# Patient Record
Sex: Male | Born: 1970 | Race: White | Hispanic: No | Marital: Single | Smoking: Never smoker
Health system: Southern US, Community
[De-identification: ages and names within clinical notes are randomized; demographics above are authoritative.]

## PROBLEM LIST (undated history)

## (undated) DIAGNOSIS — K219 Gastro-esophageal reflux disease without esophagitis: Secondary | ICD-10-CM

## (undated) DIAGNOSIS — R42 Dizziness and giddiness: Secondary | ICD-10-CM

## (undated) HISTORY — PX: WISDOM TOOTH EXTRACTION: SHX21

---

## 2009-07-06 ENCOUNTER — Emergency Department: Payer: Self-pay | Admitting: Internal Medicine
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
## 2015-02-13 ENCOUNTER — Encounter: Payer: Self-pay | Admitting: Emergency Medicine

## 2015-02-13 ENCOUNTER — Emergency Department
Admission: EM | Admit: 2015-02-13 | Discharge: 2015-02-13 | Disposition: A | Discharge status: Home / Self Care | Payer: BLUE CROSS/BLUE SHIELD | Attending: Emergency Medicine | Admitting: Emergency Medicine

## 2015-02-13 DIAGNOSIS — K2970 Gastritis, unspecified, without bleeding: Secondary | ICD-10-CM | POA: Diagnosis not present

## 2015-02-13 DIAGNOSIS — R1013 Epigastric pain: Secondary | ICD-10-CM

## 2015-02-13 DIAGNOSIS — R109 Unspecified abdominal pain: Secondary | ICD-10-CM | POA: Diagnosis present

## 2015-02-13 LAB — COMPREHENSIVE METABOLIC PANEL
ALT: 81 U/L — AB (ref 17–63)
AST: 84 U/L — AB (ref 15–41)
Albumin: 4.7 g/dL (ref 3.5–5.0)
Alkaline Phosphatase: 84 U/L (ref 38–126)
Anion gap: 8 (ref 5–15)
BUN: 13 mg/dL (ref 6–20)
CHLORIDE: 100 mmol/L — AB (ref 101–111)
CO2: 30 mmol/L (ref 22–32)
CREATININE: 0.97 mg/dL (ref 0.61–1.24)
Calcium: 9.3 mg/dL (ref 8.9–10.3)
GFR calc Af Amer: >60 mL/min (ref >60)
GLUCOSE: 130 mg/dL — AB (ref 65–99)
Potassium: 4.4 mmol/L (ref 3.5–5.1)
SODIUM: 138 mmol/L (ref 135–145)
Total Bilirubin: 1.1 mg/dL (ref 0.3–1.2)
Total Protein: 8.3 g/dL — ABNORMAL HIGH (ref 6.5–8.1)

## 2015-02-13 LAB — CBC
HCT: 47.1 % (ref 40.0–52.0)
Hemoglobin: 16.5 g/dL (ref 13.0–18.0)
MCH: 30.6 pg (ref 26.0–34.0)
MCHC: 35.1 g/dL (ref 32.0–36.0)
MCV: 87.1 fL (ref 80.0–100.0)
PLATELETS: 150 10*3/uL (ref 150–440)
RBC: 5.4 MIL/uL (ref 4.40–5.90)
RDW: 12.8 % (ref 11.5–14.5)
WBC: 5.3 10*3/uL (ref 3.8–10.6)

## 2015-02-13 LAB — URINALYSIS COMPLETE WITH MICROSCOPIC (ARMC ONLY)
BILIRUBIN URINE: NEGATIVE
Bacteria, UA: NONE SEEN
GLUCOSE, UA: NEGATIVE mg/dL
Ketones, ur: NEGATIVE mg/dL
Leukocytes, UA: NEGATIVE
Nitrite: NEGATIVE
Protein, ur: NEGATIVE mg/dL
Specific Gravity, Urine: 1.017 (ref 1.005–1.030)
pH: 6 (ref 5.0–8.0)

## 2015-02-13 LAB — LIPASE, BLOOD: LIPASE: 20 U/L (ref 11–51)

## 2015-02-13 MED ORDER — RANITIDINE HCL 150 MG PO TABS: 150.0000 mg | ORAL_TABLET | Freq: Every day | ORAL | Status: DC

## 2015-02-13 NOTE — ED Provider Notes (Addendum)
[facility] Emergency Department Provider Note     Time seen: ----------------------------------------- 10:55 AM on 02/13/2015 -----------------------------------------    I have reviewed the triage vital signs and the nursing notes.   HISTORY  Chief Complaint Abdominal Pain    HPI Nathan Fitzpatrick is a 44 y.o. male who presents to ER with a full feeling over the last couple days. Patient feels like his food is not going down, he had GI symptoms starting this weekend. He has a lot of vomiting but no diarrhea, is passing gas and able to burp. He denies fevers chills or other complaints. Earlier he was having some upper abdominal pressure but this is gone now.   History reviewed. No pertinent past medical history.  There are no active problems to display for this patient.   History reviewed. No pertinent past surgical history.  Allergies Review of patient's allergies indicates no known allergies.  Social History Social History  Substance Use Topics  . Smoking status: Never Smoker   . Smokeless tobacco: None  . Alcohol Use: No    Review of Systems Constitutional: Negative for fever. Eyes: Negative for visual changes. ENT: Negative for sore throat. Cardiovascular: Negative for chest pain. Respiratory: Negative for shortness of breath. Gastrointestinal: Positive for abdominal pain Genitourinary: Negative for dysuria. Musculoskeletal: Negative for back pain. Skin: Negative for rash. Neurological: Negative for headaches, focal weakness or numbness.  10-point ROS otherwise negative.  ____________________________________________   PHYSICAL EXAM:  VITAL SIGNS: ED Triage Vitals  Enc Vitals Group     BP 02/13/15 0828 118/84 mmHg     Pulse Rate 02/13/15 0828 109     Resp 02/13/15 0828 20     Temp 02/13/15 0828 98 F (36.7 C)     Temp Source 02/13/15 0828 Oral     SpO2 02/13/15 0828 98 %     Weight 02/13/15 0828 192 lb  (87.091 kg)     Height 02/13/15 0828 5\' 10"  (1.778 m)     Head Cir --      Peak Flow --      Pain Score 02/13/15 0828 4     Pain Loc --      Pain Edu? --      Excl. in [place]? --     Constitutional: Alert and oriented. Well appearing and in no distress. Eyes: Conjunctivae are normal. PERRL. Normal extraocular movements. ENT   Head: Normocephalic and atraumatic.   Nose: No congestion/rhinnorhea.   Mouth/Throat: Mucous membranes are moist.   Neck: No stridor. Cardiovascular: Normal rate, regular rhythm. Normal and symmetric distal pulses are present in all extremities. No murmurs, rubs, or gallops. Respiratory: Normal respiratory effort without tachypnea nor retractions. Breath sounds are clear and equal bilaterally. No wheezes/rales/rhonchi. Gastrointestinal: Soft and nontender. No distention. No abdominal bruits.  Musculoskeletal: Nontender with normal range of motion in all extremities. No joint effusions.  No lower extremity tenderness nor edema. Neurologic:  Normal speech and language. No gross focal neurologic deficits are appreciated. Speech is normal. No gait instability. Skin:  Skin is warm, dry and intact. No rash noted. Psychiatric: Mood and affect are normal. Speech and behavior are normal. Patient exhibits appropriate insight and judgment.  ____________________________________________  ED COURSE:  Pertinent labs & imaging results that were available during my care of the patient were reviewed by me and considered in my medical decision making (see chart for details). Patient is in no acute distress, will check basic labs and likely imaging and GI cocktail.  _ EKG: Interpreted by me normal EKG, normal sinus rhythm, with normal axis, normal intervals. No evidence of hypertrophy or acute infarction. Rate is 95 bpm. ___________________________________________    LABS (pertinent positives/negatives)  Labs Reviewed  COMPREHENSIVE METABOLIC PANEL - Abnormal; Notable  for the following:    Chloride 100 (*)    Glucose, Bld 130 (*)    Total Protein 8.3 (*)    AST 84 (*)    ALT 81 (*)    All other components within normal limits  URINALYSIS COMPLETEWITH MICROSCOPIC ([facility] ONLY) - Abnormal; Notable for the following:    Color, Urine YELLOW (*)    APPearance CLEAR (*)    Hgb urine dipstick 2+ (*)    Squamous Epithelial / LPF 0-5 (*)    All other components within normal limits  LIPASE, BLOOD  CBC    ____________________________________________  FINAL ASSESSMENT AND PLAN  Gastritis  Plan: Patient with labs and imaging as dictated above. Patient with recent gastritis, I offered GI cocktail basic x-rays. Patient declined at this time. His labs are normal except for mild liver transaminase elevation. I will refer to GI for outpatient follow-up. Currently symptoms have resolved he'll be discharged with Zantac.   Earleen Newport, MD   Earleen Newport, MD 02/13/15 1114  Earleen Newport, MD 02/13/15 1119  Earleen Newport, MD 03/04/15 1712  Earleen Newport, MD 03/04/15 [phone]

## 2015-02-13 NOTE — ED Notes (Addendum)
States he is having a full feeling for the past couple of days   Feels like his food is not going down started with GI sx's this weekend .Marland Kitchen

## 2015-02-13 NOTE — Discharge Instructions (Signed)

## 2016-07-10 ENCOUNTER — Other Ambulatory Visit: Payer: Self-pay

## 2016-07-10 ENCOUNTER — Encounter: Payer: Self-pay | Admitting: Gastroenterology

## 2016-07-10 ENCOUNTER — Ambulatory Visit (INDEPENDENT_AMBULATORY_CARE_PROVIDER_SITE_OTHER): Payer: Self-pay | Admitting: Gastroenterology

## 2016-07-10 VITALS — BP 131/86 | HR 84 | Temp 97.5°F | Ht 70.0 in | Wt 186.0 lb

## 2016-07-10 DIAGNOSIS — K219 Gastro-esophageal reflux disease without esophagitis: Secondary | ICD-10-CM

## 2016-07-10 NOTE — Progress Notes (Signed)
Gastroenterology Consultation  Referring Provider:     No ref. provider found Primary Care Physician:  Patient, No Pcp Per Primary Gastroenterologist:  Dr. Allen Norris     Reason for Consultation:     GERD        HPI:   Nathan Fitzpatrick is a 46 y.o. y/o male referred for consultation & management of GERD by Dr. Patient, No Pcp Per.  This patient comes in today with a report of a few weeks of a tickle in his throat that then became severe heartburn. The patient denies ever having this before these episodes. He also reports that he started omeprazole 20 mg a day and states he got some improvement from that but not complete. The patient then started taking 20 mg of omeprazole twice a day with better results but still having some acid breakthrough. The patient denies any black stools bloody stools fevers chills nausea or vomiting. There is also no report of any unexplained weight loss or food is stuck whereupon he has to vomit the food out.  No past medical history on file.  No past surgical history on file.  Prior to Admission medications   Medication Sig Start Date End Date Taking? Authorizing Provider  omeprazole (PRILOSEC OTC) 20 MG tablet Take 20 mg by mouth daily.   Yes [provider]  amoxicillin-clavulanate (AUGMENTIN) 875-125 MG tablet amoxicillin 875 mg-potassium clavulanate 125 mg tablet  Take 1 tablet every 12 hours by oral route.    [provider]  etodolac (LODINE) 500 MG tablet etodolac 500 mg tablet  TAKE 1 TABLET BY MOUTH TWICE A DAY WITH FOOD    [provider]  ranitidine (ZANTAC) 150 MG tablet Take 1 tablet (150 mg total) by mouth at bedtime. 02/13/15 02/13/16  Earleen Newport, MD    No family history on file.   Social History  Substance Use Topics  . Smoking status: Never Smoker  . Smokeless tobacco: Never Used  . Alcohol use No    Allergies as of 07/10/2016  . (No Known Allergies)    Review of Systems:    All systems  reviewed and negative except where noted in HPI.   Physical Exam:  BP 131/86   Pulse 84   Temp 97.5 F (36.4 C) (Oral)   Ht 5\' 10"  (1.778 m)   Wt 186 lb (84.4 kg)   BMI 26.69 kg/m  No LMP for male patient. Psych:  Alert and cooperative. Normal mood and affect. General:   Alert,  Well-developed, well-nourished, pleasant and cooperative in NAD Head:  Normocephalic and atraumatic. Eyes:  Sclera clear, no icterus.   Conjunctiva pink. Ears:  Normal auditory acuity. Nose:  No deformity, discharge, or lesions. Mouth:  No deformity or lesions,oropharynx pink & moist. Neck:  Supple; no masses or thyromegaly. Lungs:  Respirations even and unlabored.  Clear throughout to auscultation.   No wheezes, crackles, or rhonchi. No acute distress. Heart:  Regular rate and rhythm; no murmurs, clicks, rubs, or gallops. Abdomen:  Normal bowel sounds.  No bruits.  Soft, non-tender and non-distended without masses, hepatosplenomegaly or hernias noted.  No guarding or rebound tenderness.  Negative Carnett sign.   Rectal:  Deferred.  Msk:  Symmetrical without gross deformities.  Good, equal movement & strength bilaterally. Pulses:  Normal pulses noted. Extremities:  No clubbing or edema.  No cyanosis. Neurologic:  Alert and oriented x3;  grossly normal neurologically. Skin:  Intact without significant lesions or rashes.  No jaundice.  Lymph Nodes:  No significant cervical adenopathy. Psych:  Alert and cooperative. Normal mood and affect.  Imaging Studies: No results found.  Assessment and Plan:   Nathan Fitzpatrick is a 46 y.o. y/o male with a history of reflux that has started a few weeks ago as a tickle in the throat and then patient reports overt heartburn. The patient has been treated with omeprazole 20 mg twice a day with recurrent acid breakthrough. The patient will be started on a trial of Dexilant and set up for an EGD. I have discussed risks & benefits which include, but are not limited  to, bleeding, infection, perforation & drug reaction.  The patient agrees with this plan & written consent will be obtained.     Lucilla Lame, MD. Marval Regal   Note: This dictation was prepared with Dragon dictation along with smaller phrase technology. Any transcriptional errors that result from this process are unintentional.

## 2016-09-04 ENCOUNTER — Telehealth: Payer: Self-pay | Admitting: Gastroenterology

## 2016-09-04 NOTE — Telephone Encounter (Signed)
Patient left a voice message that he needs to schedule an EGD

## 2016-09-05 ENCOUNTER — Other Ambulatory Visit: Payer: Self-pay

## 2016-09-05 DIAGNOSIS — K219 Gastro-esophageal reflux disease without esophagitis: Secondary | ICD-10-CM

## 2016-09-05 NOTE — Telephone Encounter (Signed)
Appt for EGD has been scheduled for Monday August 13th at [facility].

## 2016-09-22 ENCOUNTER — Encounter: Payer: Self-pay | Admitting: Anesthesiology

## 2016-09-25 ENCOUNTER — Other Ambulatory Visit: Payer: Self-pay

## 2016-09-25 DIAGNOSIS — K2100 Gastro-esophageal reflux disease with esophagitis, without bleeding: Secondary | ICD-10-CM

## 2016-09-26 ENCOUNTER — Encounter: Payer: Self-pay | Admitting: Anesthesiology

## 2016-09-26 ENCOUNTER — Ambulatory Visit
Admission: RE | Admit: 2016-09-26 | Payer: Managed Care, Other (non HMO) | Source: Ambulatory Visit | Admitting: Gastroenterology

## 2016-09-26 ENCOUNTER — Encounter: Admission: RE | Payer: Self-pay | Source: Ambulatory Visit

## 2016-09-26 SURGERY — ESOPHAGOGASTRODUODENOSCOPY (EGD) WITH PROPOFOL
Anesthesia: General

## 2016-09-29 ENCOUNTER — Other Ambulatory Visit: Payer: Self-pay

## 2016-09-29 ENCOUNTER — Ambulatory Visit: Admission: RE | Admit: 2016-09-29 | Payer: Self-pay | Source: Ambulatory Visit | Admitting: Gastroenterology

## 2016-09-29 DIAGNOSIS — K2100 Gastro-esophageal reflux disease with esophagitis, without bleeding: Secondary | ICD-10-CM

## 2016-09-29 HISTORY — DX: Gastro-esophageal reflux disease without esophagitis: K21.9

## 2016-09-29 HISTORY — DX: Dizziness and giddiness: R42

## 2016-09-29 SURGERY — ESOPHAGOGASTRODUODENOSCOPY (EGD) WITH PROPOFOL
Anesthesia: Choice

## 2016-10-14 ENCOUNTER — Ambulatory Visit
Admission: RE | Admit: 2016-10-14 | Discharge: 2016-10-14 | Disposition: A | Discharge status: Home / Self Care | Payer: Managed Care, Other (non HMO) | Source: Ambulatory Visit | Attending: Gastroenterology | Admitting: Gastroenterology

## 2016-10-14 ENCOUNTER — Encounter
Admission: RE | Disposition: A | Discharge status: Home / Self Care | Payer: Self-pay | Source: Ambulatory Visit | Attending: Gastroenterology

## 2016-10-14 ENCOUNTER — Ambulatory Visit: Payer: Managed Care, Other (non HMO) | Admitting: Anesthesiology

## 2016-10-14 DIAGNOSIS — K2950 Unspecified chronic gastritis without bleeding: Secondary | ICD-10-CM | POA: Insufficient documentation

## 2016-10-14 DIAGNOSIS — K219 Gastro-esophageal reflux disease without esophagitis: Secondary | ICD-10-CM | POA: Diagnosis present

## 2016-10-14 DIAGNOSIS — Z79899 Other long term (current) drug therapy: Secondary | ICD-10-CM | POA: Diagnosis not present

## 2016-10-14 DIAGNOSIS — R1310 Dysphagia, unspecified: Secondary | ICD-10-CM

## 2016-10-14 DIAGNOSIS — D490 Neoplasm of unspecified behavior of digestive system: Secondary | ICD-10-CM

## 2016-10-14 HISTORY — PX: ESOPHAGOGASTRODUODENOSCOPY (EGD) WITH PROPOFOL: SHX5813

## 2016-10-14 SURGERY — ESOPHAGOGASTRODUODENOSCOPY (EGD) WITH PROPOFOL
Anesthesia: General

## 2016-10-14 MED ORDER — SODIUM CHLORIDE 0.9 % IV SOLN
INTRAVENOUS | Status: DC | PRN
  Administered 2016-10-14: 1000 mL
  Administered 2016-10-14: 11:00:00 via INTRAVENOUS

## 2016-10-14 MED ORDER — LIDOCAINE 2% (20 MG/ML) 5 ML SYRINGE
INTRAMUSCULAR | Status: DC | PRN
  Administered 2016-10-14: 40 mg via INTRAVENOUS

## 2016-10-14 MED ORDER — PROPOFOL 10 MG/ML IV BOLUS
INTRAVENOUS | Status: DC | PRN
  Administered 2016-10-14: 100 mg via INTRAVENOUS

## 2016-10-14 MED ORDER — PROPOFOL 500 MG/50ML IV EMUL
INTRAVENOUS | Status: DC | PRN
  Administered 2016-10-14: 180 ug/kg/min via INTRAVENOUS

## 2016-10-14 MED ORDER — PROPOFOL 500 MG/50ML IV EMUL
INTRAVENOUS | Status: AC
Start: 2016-10-14 — End: 2016-10-14
  Filled 2016-10-14: qty 50

## 2016-10-14 NOTE — Transfer of Care (Signed)
Immediate Anesthesia Transfer of Care Note  Patient: Nathan Fitzpatrick  Procedure(s) Performed: Procedure(s): ESOPHAGOGASTRODUODENOSCOPY (EGD) WITH PROPOFOL (N/A)  Patient Location: PACU and Endoscopy Unit  Anesthesia Type:General  Level of Consciousness: awake and patient cooperative  Airway & Oxygen Therapy: Patient Spontanous Breathing and Patient connected to nasal cannula oxygen  Post-op Assessment: Report given to RN and Post -op Vital signs reviewed and stable  Post vital signs: stable  Last Vitals:  Vitals:   10/14/16 1144 10/14/16 1149  BP: 110/77 110/77  Pulse: 60 (!) 59  Resp: 13 14  Temp: 36.6 C   SpO2: 100% 100%    Last Pain:  Vitals:   10/14/16 1144  TempSrc: Tympanic         Complications: No apparent anesthesia complications

## 2016-10-14 NOTE — Anesthesia Post-op Follow-up Note (Signed)
Anesthesia QCDR form completed.        

## 2016-10-14 NOTE — Anesthesia Preprocedure Evaluation (Signed)
Anesthesia Evaluation  Patient identified by MRN, date of birth, ID band Patient awake    Reviewed: Allergy & Precautions, NPO status , Patient's Chart, lab work & pertinent test results  Airway Mallampati: II  TM Distance: >3 FB     Dental  (+) Teeth Intact   Pulmonary    Pulmonary exam normal        Cardiovascular negative cardio ROS Normal cardiovascular exam     Neuro/Psych vertigo negative psych ROS   GI/Hepatic Neg liver ROS, GERD  Medicated,  Endo/Other  negative endocrine ROS  Renal/GU negative Renal ROS  negative genitourinary   Musculoskeletal negative musculoskeletal ROS (+)   Abdominal   Peds negative pediatric ROS (+)  Hematology negative hematology ROS (+)   Anesthesia Other Findings   Reproductive/Obstetrics                             Anesthesia Physical Anesthesia Plan  ASA: II  Anesthesia Plan: General   Post-op Pain Management:    Induction: Intravenous  PONV Risk Score and Plan:   Airway Management Planned: Nasal Cannula  Additional Equipment:   Intra-op Plan:   Post-operative Plan:   Informed Consent: I have reviewed the patients History and Physical, chart, labs and discussed the procedure including the risks, benefits and alternatives for the proposed anesthesia with the patient or authorized representative who has indicated his/her understanding and acceptance.   Dental advisory given  Plan Discussed with: CRNA and Surgeon  Anesthesia Plan Comments:         Anesthesia Quick Evaluation

## 2016-10-14 NOTE — H&P (Signed)
   Lucilla Lame, MD [facility] [address] [place] [place],  [address] Phone:[phone] Fax : [phone]  Primary Care Physician:  Stoney Bang, MD Primary Gastroenterologist:  Dr. Allen Norris  Pre-Procedure History & Physical: HPI:  Masashi Snowdon is a 46 y.o. male is here for an endoscopy.   Past Medical History:  Diagnosis Date  . GERD (gastroesophageal reflux disease)   . Vertigo    at times    Past Surgical History:  Procedure Laterality Date  . WISDOM TOOTH EXTRACTION      Prior to Admission medications   Medication Sig Start Date End Date Taking? Authorizing Provider  omeprazole (PRILOSEC OTC) 20 MG tablet Take 20 mg by mouth daily.   Yes [provider]  amoxicillin-clavulanate (AUGMENTIN) 875-125 MG tablet amoxicillin 875 mg-potassium clavulanate 125 mg tablet  Take 1 tablet every 12 hours by oral route.    [provider]  etodolac (LODINE) 500 MG tablet etodolac 500 mg tablet  TAKE 1 TABLET BY MOUTH TWICE A DAY WITH FOOD    [provider]  ranitidine (ZANTAC) 150 MG tablet Take 1 tablet (150 mg total) by mouth at bedtime. 02/13/15 02/13/16  Earleen Newport, MD    Allergies as of 09/29/2016  . (No Known Allergies)    No family history on file.  Social History   Social History  . Marital status: Single    Spouse name: N/A  . Number of children: N/A  . Years of education: N/A   Occupational History  . Not on file.   Social History Main Topics  . Smoking status: Never Smoker  . Smokeless tobacco: Never Used  . Alcohol use No  . Drug use: No  . Sexual activity: Not on file   Other Topics Concern  . Not on file   Social History Narrative  . No narrative on file    Review of Systems: See HPI, otherwise negative ROS  Physical Exam: BP 119/83   Pulse 69   Temp 97.8 F (36.6 C) (Tympanic)   Resp 20   Ht 5\' 10"  (1.778 m)   Wt 180 lb (81.6 kg)   SpO2 100%   BMI 25.83 kg/m  General:    Alert,  pleasant and cooperative in NAD Head:  Normocephalic and atraumatic. Neck:  Supple; no masses or thyromegaly. Lungs:  Clear throughout to auscultation.    Heart:  Regular rate and rhythm. Abdomen:  Soft, nontender and nondistended. Normal bowel sounds, without guarding, and without rebound.   Neurologic:  Alert and  oriented x4;  grossly normal neurologically.  Impression/Plan: Samuell Knoble is here for an endoscopy to be performed for GERD  Risks, benefits, limitations, and alternatives regarding  endoscopy have been reviewed with the patient.  Questions have been answered.  All parties agreeable.   Lucilla Lame, MD  10/14/2016, 10:55 AM

## 2016-10-14 NOTE — Anesthesia Postprocedure Evaluation (Signed)
Anesthesia Post Note  Patient: Nathan Fitzpatrick  Procedure(s) Performed: Procedure(s) (LRB): ESOPHAGOGASTRODUODENOSCOPY (EGD) WITH PROPOFOL (N/A)  Patient location during evaluation: PACU Anesthesia Type: General Level of consciousness: awake and alert and oriented Pain management: pain level controlled Vital Signs Assessment: post-procedure vital signs reviewed and stable Respiratory status: spontaneous breathing Cardiovascular status: blood pressure returned to baseline Anesthetic complications: no     Last Vitals:  Vitals:   10/14/16 1144 10/14/16 1149  BP: 110/77 110/77  Pulse: 60 (!) 59  Resp: 13 14  Temp: 36.6 C   SpO2: 100% 100%    Last Pain:  Vitals:   10/14/16 1144  TempSrc: Tympanic                 Nickoles Gregori

## 2016-10-14 NOTE — Op Note (Signed)
[facility] Patient Name: Nathan Fitzpatrick Procedure Date: 10/14/2016 11:24 AM MRN: [id number] Account #: [id number] Date of Birth: [birth year] Admit Type: Outpatient Age: 46 Room: [facility] ENDO ROOM 4 Gender: Male Note Status: Finalized Procedure:            Upper GI endoscopy Indications:          Heartburn Providers:            Lucilla Lame MD, MD Referring MD:         No Local Md, MD (Referring MD) Medicines:            Propofol per Anesthesia Complications:        No immediate complications. Procedure:            Pre-Anesthesia Assessment:                       - Prior to the procedure, a History and Physical was                        performed, and patient medications and allergies were                        reviewed. The patient's tolerance of previous                        anesthesia was also reviewed. The risks and benefits of                        the procedure and the sedation options and risks were                        discussed with the patient. All questions were                        answered, and informed consent was obtained. Prior                        Anticoagulants: The patient has taken no previous                        anticoagulant or antiplatelet agents. ASA Grade                        Assessment: II - A patient with mild systemic disease.                        After reviewing the risks and benefits, the patient was                        deemed in satisfactory condition to undergo the                        procedure.                       After obtaining informed consent, the endoscope was                        passed under direct vision. Throughout the procedure,  the patient's blood pressure, pulse, and oxygen                        saturations were monitored continuously. The Endoscope                        was introduced through the mouth, and advanced to the                        second  part of duodenum. The upper GI endoscopy was                        accomplished without difficulty. The patient tolerated                        the procedure well. Findings:      The examined esophagus was normal.      Localized mild inflammation characterized by erythema was found in the       gastric antrum. Biopsies were taken with a cold forceps for histology.      The examined duodenum was normal. Impression:           - Normal esophagus.                       - Gastritis. Biopsied.                       - Normal examined duodenum. Recommendation:       - Discharge patient to home.                       - Resume previous diet.                       - Continue present medications.                       - Await pathology results. Procedure Code(s):    --- Professional ---                       [phone], Esophagogastroduodenoscopy, flexible, transoral;                        with biopsy, single or multiple Diagnosis Code(s):    --- Professional ---                       R12, Heartburn                       K29.70, Gastritis, unspecified, without bleeding CPT copyright 2016 American Medical Association. All rights reserved. The codes documented in this report are preliminary and upon coder review may  be revised to meet current compliance requirements. Lucilla Lame MD, MD 10/14/2016 11:36:14 AM This report has been signed electronically. Number of Addenda: 0 Note Initiated On: 10/14/2016 11:24 AM      [facility]

## 2016-10-15 LAB — SURGICAL PATHOLOGY

## 2016-10-16 ENCOUNTER — Encounter: Payer: Self-pay | Admitting: Gastroenterology

## 2017-04-09 ENCOUNTER — Other Ambulatory Visit: Payer: Self-pay

## 2017-04-09 MED ORDER — DEXLANSOPRAZOLE 60 MG PO CPDR: 60.0000 mg | DELAYED_RELEASE_CAPSULE | Freq: Every day | ORAL | 6 refills | Status: DC

## 2017-05-04 ENCOUNTER — Telehealth: Payer: Self-pay | Admitting: Gastroenterology

## 2017-05-04 NOTE — Telephone Encounter (Signed)
*  STAT* If patient is at the pharmacy, call can be transferred to refill team.   1. Which medications need to be refilled? (please list name of each medication and dose if known) Dexilant   2. Which pharmacy/location (including street and city if local pharmacy) is medication to be sent to? [facility]  3. Do they need a 30 day or 90 day supply? 30 day

## 2017-05-05 ENCOUNTER — Telehealth: Payer: Self-pay | Admitting: Gastroenterology

## 2017-05-05 NOTE — Telephone Encounter (Signed)
Pt left vm for Dr. Allen Norris he needs rx Dexaliane  Needs prior Authorization for [facility] court pharamcy please call [phone] to give them prior auth

## 2017-05-06 NOTE — Telephone Encounter (Signed)
Prior authorization has been obtained for Dexilant 60mg  via express scripts and [email or web address].  [id number];FXJOIT:[id number];Review Type:Prior Auth;Coverage Start Date:04/06/2017;Coverage End Date:05/06/2018;

## 2017-05-25 ENCOUNTER — Encounter: Payer: Self-pay | Admitting: Gastroenterology

## 2017-05-25 ENCOUNTER — Ambulatory Visit: Payer: Managed Care, Other (non HMO) | Admitting: Gastroenterology

## 2017-05-25 ENCOUNTER — Encounter (INDEPENDENT_AMBULATORY_CARE_PROVIDER_SITE_OTHER): Payer: Self-pay

## 2017-05-25 VITALS — BP 120/82 | HR 70 | Ht 70.0 in | Wt 196.0 lb

## 2017-05-25 DIAGNOSIS — K219 Gastro-esophageal reflux disease without esophagitis: Secondary | ICD-10-CM

## 2017-05-25 NOTE — Progress Notes (Signed)
Primary Care Physician: Pauline Good Thornell Mule, MD  Primary Gastroenterologist:  Dr. Lucilla Lame  Chief Complaint  Patient presents with  . Gastroesophageal Reflux    HPI: Nathan Fitzpatrick is a 47 y.o. male here for follow-up of heartburn and GERD. The patient reports he is taking Dexilant daily now and states that he has intermittent pain in the lower part of his chest.  He does not report this to be the same burning sensation he had when his heartburn started.  The patient also denies any dysphagia or unexplained weight loss.  The patient has had an EGD that did not show any signs of the cause of his present symptoms.  He reports that the discomfort in his chest/epigastric region is usually at night and not associated with any particular foods or even with eating.  Current Outpatient Medications  Medication Sig Dispense Refill  . dexlansoprazole (DEXILANT) 60 MG capsule Take 1 capsule (60 mg total) by mouth daily. 30 capsule 6  . etodolac (LODINE) 500 MG tablet etodolac 500 mg tablet  TAKE 1 TABLET BY MOUTH TWICE A DAY WITH FOOD    . omeprazole (PRILOSEC OTC) 20 MG tablet Take 20 mg by mouth daily.    . ranitidine (ZANTAC) 150 MG tablet Take 1 tablet (150 mg total) by mouth at bedtime. 30 tablet 1   No current facility-administered medications for this visit.     Allergies as of 05/25/2017  . (No Known Allergies)    ROS:  General: Negative for anorexia, weight loss, fever, chills, fatigue, weakness. ENT: Negative for hoarseness, difficulty swallowing , nasal congestion. CV: Negative for chest pain, angina, palpitations, dyspnea on exertion, peripheral edema.  Respiratory: Negative for dyspnea at rest, dyspnea on exertion, cough, sputum, wheezing.  GI: See history of present illness. GU:  Negative for dysuria, hematuria, urinary incontinence, urinary frequency, nocturnal urination.  Endo: Negative for unusual weight change.    Physical Examination:   BP 120/82    Pulse 70   Ht 5\' 10"  (1.778 m)   Wt 196 lb (88.9 kg)   BMI 28.12 kg/m   General: Well-nourished, well-developed in no acute distress.  Eyes: No icterus. Conjunctivae pink. Mouth: Oropharyngeal mucosa moist and pink , no lesions erythema or exudate. Lungs: Clear to auscultation bilaterally. Non-labored. Heart: Regular rate and rhythm, no murmurs rubs or gallops.  Abdomen: Bowel sounds are normal, nontender, nondistended, no hepatosplenomegaly or masses, no abdominal bruits or hernia , no rebound or guarding.   Extremities: No lower extremity edema. No clubbing or deformities. Neuro: Alert and oriented x 3.  Grossly intact. Skin: Warm and dry, no jaundice.   Psych: Alert and cooperative, normal mood and affect.  Labs:    Imaging Studies: No results found.  Assessment and Plan:   Nathan Fitzpatrick is a 47 y.o. y/o male who comes in today with a history of heartburn who is taking Dexilant.  The patient has now reported intermittent epigastric/chest discomfort. The patient denies this to be related to any particular foods or does he notice anything that makes it better or worse.  The patient has been told to take Tums when he has his discomfort to see if it goes away thereby discriminating it from a non-GI cause of his symptoms.  The patient has been told that if his symptoms continue after taking the antacids than we may decrease of Dexilant to 30 mg a day to see if that controls his heartburn symptoms.  If his symptoms improve  on the antacids then we may need to switch him to Protonix twice a day since he may be having acid breakthrough that is resolved with Tums.  The patient has been explained the plan and will contact us with the results of his supplemental antacids.    Lucilla Lame, MD. Marval Regal   Note: This dictation was prepared with Dragon dictation along with smaller phrase technology. Any transcriptional errors that result from this process are unintentional.

## 2017-05-27 ENCOUNTER — Telehealth: Payer: Self-pay | Admitting: Gastroenterology

## 2017-05-27 ENCOUNTER — Other Ambulatory Visit: Payer: Self-pay

## 2017-05-27 DIAGNOSIS — K2100 Gastro-esophageal reflux disease with esophagitis, without bleeding: Secondary | ICD-10-CM

## 2017-05-27 NOTE — Telephone Encounter (Signed)
I advised pt if he is wanting a referral for anti reflux surgery he will will need a PH study and Manometry. I have ordered and scheduled these procedures. He says you talked with him about this at his last office appt.

## 2017-05-27 NOTE — Telephone Encounter (Signed)
Pt left vm to speak to Dr. Allen Norris in regards to seeking a surgical solution for his GERD rather then medicating it please call pt

## 2017-05-28 NOTE — Telephone Encounter (Signed)
Yes I did talk to him but he said he wanted to think about it. If he is ready to go forward then please set him up with surgery. I think [facility] May be doing it but we can find out if [facility] does it.

## 2017-06-11 ENCOUNTER — Telehealth: Payer: Self-pay

## 2017-06-11 NOTE — Telephone Encounter (Signed)
Patient has been informed that our Esophageal Manometry Equipment is being repaired at this time, and a complete test would not be available.  Discussed with Dr. Allen Norris and he advised canceling the test, and having the patient to see a surgeon.  Informed patient of this and he is in agreement with this.  Patient will look into finding a surgeon and will contact us back when he finds one.  I offered to refer, and he said he will look into it.  Thanks [employer]

## 2017-06-16 ENCOUNTER — Telehealth: Payer: Self-pay | Admitting: Gastroenterology

## 2017-06-16 NOTE — Telephone Encounter (Signed)
Emailed pt regarding this referral to [facility] for a surgical consultation.

## 2017-06-16 NOTE — Telephone Encounter (Signed)
Pt left vm he states Dr. Allen Norris was supposed to do a manometry study but Sharyn Lull told pt there was a problem with the machines and that pt was going to receive a referral pt would like a call to check on status this

## 2017-06-17 ENCOUNTER — Encounter: Admission: RE | Payer: Self-pay | Source: Ambulatory Visit

## 2017-06-17 ENCOUNTER — Ambulatory Visit
Admission: RE | Admit: 2017-06-17 | Payer: Managed Care, Other (non HMO) | Source: Ambulatory Visit | Admitting: Gastroenterology

## 2017-06-17 SURGERY — MANOMETRY, ESOPHAGUS

## 2017-06-23 DIAGNOSIS — K219 Gastro-esophageal reflux disease without esophagitis: Secondary | ICD-10-CM | POA: Insufficient documentation

## 2017-07-23 ENCOUNTER — Telehealth: Payer: Self-pay | Admitting: Gastroenterology

## 2017-07-23 NOTE — Telephone Encounter (Signed)
Patient LVM and would like for you to email him back so he know you received his email.

## 2017-12-17 ENCOUNTER — Telehealth: Payer: Self-pay | Admitting: Gastroenterology

## 2017-12-17 ENCOUNTER — Other Ambulatory Visit: Payer: Self-pay

## 2017-12-17 NOTE — Telephone Encounter (Signed)
Pt is calling to get refill on rx Amprezole 10 mg send to [facility]

## 2017-12-18 ENCOUNTER — Other Ambulatory Visit: Payer: Self-pay

## 2017-12-18 MED ORDER — OMEPRAZOLE MAGNESIUM 20 MG PO TBEC: 20.0000 mg | DELAYED_RELEASE_TABLET | Freq: Every day | ORAL | 6 refills | Status: DC

## 2017-12-18 NOTE — Telephone Encounter (Signed)
Refill for Omeprazole has been sent to [facility] per pt request.

## 2017-12-24 ENCOUNTER — Telehealth: Payer: Self-pay | Admitting: Gastroenterology

## 2017-12-24 NOTE — Telephone Encounter (Signed)
Pt called he needs the rx  For 10 mg it was called in for more then that please refill for 10 mg

## 2017-12-25 NOTE — Telephone Encounter (Signed)
[facility] to change the mg of Omeprazole 20 to 10mg . Pt was notified this was done.

## 2018-03-31 ENCOUNTER — Other Ambulatory Visit: Payer: Self-pay

## 2018-03-31 MED ORDER — OMEPRAZOLE 10 MG PO CPDR: 10.0000 mg | DELAYED_RELEASE_CAPSULE | Freq: Every day | ORAL | 3 refills | Status: DC

## 2019-11-23 ENCOUNTER — Other Ambulatory Visit: Payer: Self-pay

## 2019-11-23 ENCOUNTER — Other Ambulatory Visit
Admission: RE | Admit: 2019-11-23 | Discharge: 2019-11-23 | Disposition: A | Discharge status: Home / Self Care | Payer: 59 | Source: Ambulatory Visit | Attending: Gastroenterology | Admitting: Gastroenterology

## 2019-11-23 DIAGNOSIS — Z20822 Contact with and (suspected) exposure to covid-19: Secondary | ICD-10-CM | POA: Insufficient documentation

## 2019-11-23 DIAGNOSIS — Z01818 Encounter for other preprocedural examination: Secondary | ICD-10-CM | POA: Insufficient documentation

## 2019-11-23 LAB — SARS CORONAVIRUS 2 (TAT 6-24 HRS): SARS Coronavirus 2: NEGATIVE

## 2019-11-24 ENCOUNTER — Encounter: Payer: Self-pay | Admitting: *Deleted

## 2019-11-25 ENCOUNTER — Ambulatory Visit: Payer: 59 | Admitting: Certified Registered Nurse Anesthetist

## 2019-11-25 ENCOUNTER — Ambulatory Visit
Admission: RE | Admit: 2019-11-25 | Discharge: 2019-11-25 | Disposition: A | Discharge status: Home / Self Care | Payer: 59 | Attending: Gastroenterology | Admitting: Gastroenterology

## 2019-11-25 ENCOUNTER — Encounter: Payer: Self-pay | Admitting: *Deleted

## 2019-11-25 ENCOUNTER — Other Ambulatory Visit: Payer: Self-pay

## 2019-11-25 ENCOUNTER — Encounter
Admission: RE | Disposition: A | Discharge status: Home / Self Care | Payer: Self-pay | Source: Home / Self Care | Attending: Gastroenterology

## 2019-11-25 DIAGNOSIS — Z1211 Encounter for screening for malignant neoplasm of colon: Secondary | ICD-10-CM | POA: Diagnosis not present

## 2019-11-25 DIAGNOSIS — K219 Gastro-esophageal reflux disease without esophagitis: Secondary | ICD-10-CM | POA: Diagnosis not present

## 2019-11-25 DIAGNOSIS — D128 Benign neoplasm of rectum: Secondary | ICD-10-CM | POA: Insufficient documentation

## 2019-11-25 DIAGNOSIS — Z79899 Other long term (current) drug therapy: Secondary | ICD-10-CM | POA: Diagnosis not present

## 2019-11-25 DIAGNOSIS — K5730 Diverticulosis of large intestine without perforation or abscess without bleeding: Secondary | ICD-10-CM | POA: Insufficient documentation

## 2019-11-25 HISTORY — PX: COLONOSCOPY WITH PROPOFOL: SHX5780

## 2019-11-25 SURGERY — COLONOSCOPY WITH PROPOFOL
Anesthesia: General

## 2019-11-25 MED ORDER — LIDOCAINE HCL (CARDIAC) PF 100 MG/5ML IV SOSY
PREFILLED_SYRINGE | INTRAVENOUS | Status: DC | PRN
  Administered 2019-11-25: 50 mg via INTRAVENOUS

## 2019-11-25 MED ORDER — PROPOFOL 10 MG/ML IV BOLUS
INTRAVENOUS | Status: DC | PRN
  Administered 2019-11-25: 18 mg via INTRAVENOUS
  Administered 2019-11-25: 80 mg via INTRAVENOUS

## 2019-11-25 MED ORDER — SODIUM CHLORIDE 0.9 % IV SOLN
INTRAVENOUS | Status: DC
  Administered 2019-11-25: 1000 mL via INTRAVENOUS

## 2019-11-25 MED ORDER — PROPOFOL 500 MG/50ML IV EMUL
INTRAVENOUS | Status: DC | PRN
  Administered 2019-11-25: 130 ug/kg/min via INTRAVENOUS

## 2019-11-25 MED ORDER — PROPOFOL 500 MG/50ML IV EMUL
INTRAVENOUS | Status: AC
  Filled 2019-11-25: qty 50

## 2019-11-25 NOTE — Anesthesia Postprocedure Evaluation (Signed)
Anesthesia Post Note  Patient: [name]  Procedure(s) Performed: COLONOSCOPY WITH PROPOFOL (N/A )  Patient location during evaluation: Endoscopy Anesthesia Type: General Level of consciousness: awake and alert Pain management: pain level controlled Vital Signs Assessment: post-procedure vital signs reviewed and stable Respiratory status: spontaneous breathing, nonlabored ventilation, respiratory function stable and patient connected to nasal cannula oxygen Cardiovascular status: blood pressure returned to baseline and stable Postop Assessment: no apparent nausea or vomiting Anesthetic complications: no   No complications documented.   Last Vitals:  Vitals:   11/25/19 1248 11/25/19 1351  BP: 118/89   Pulse: 92 75  Resp: 20 15  Temp: (!) 35.9 C (!) 36.1 C  SpO2: 99% 96%    Last Pain:  Vitals:   11/25/19 1351  TempSrc: Temporal                 Arita Miss

## 2019-11-25 NOTE — Transfer of Care (Signed)
Immediate Anesthesia Transfer of Care Note  Patient: Nathan Fitzpatrick  Procedure(s) Performed: COLONOSCOPY WITH PROPOFOL (N/A )  Patient Location: PACU and Endoscopy Unit  Anesthesia Type:General  Level of Consciousness: drowsy  Airway & Oxygen Therapy: Patient Spontanous Breathing  Post-op Assessment: Report given to RN and Post -op Vital signs reviewed and stable  Post vital signs: Reviewed and stable  Last Vitals:  Vitals Value Taken Time  BP 96/75 11/25/19 1352  Temp 36.1 C 11/25/19 1351  Pulse 77 11/25/19 1352  Resp 17 11/25/19 1352  SpO2 96 % 11/25/19 1352  Vitals shown include unvalidated device data.  Last Pain:  Vitals:   11/25/19 1351  TempSrc: Temporal      Patients Stated Pain Goal: 0 (06/34/94 9447)  Complications: No complications documented.

## 2019-11-25 NOTE — H&P (Signed)
Outpatient short stay form Pre-procedure 11/25/2019 1:17 PM Nathan Miyamoto MD, MPH  Primary Physician: Dr. Pauline Good  Reason for visit:  Screening Colonoscopy  History of present illness:   49 y/o gentleman with history of GERD here for screening colonoscopy. No family history of GI malignancies. No blood thinners. No abdominal surgeries.    Current Facility-Administered Medications:  .  0.9 %  sodium chloride infusion, , Intravenous, Continuous, Machaela Caterino, Hilton Cork, MD, Last Rate: 20 mL/hr at 11/25/19 1309, 1,000 mL at 11/25/19 1309  Medications Prior to Admission  Medication Sig Dispense Refill Last Dose  . dexlansoprazole (DEXILANT) 60 MG capsule Take 1 capsule (60 mg total) by mouth daily. (Patient not taking: Reported on 11/25/2019) 30 capsule 6 Completed Course at Unknown time  . etodolac (LODINE) 500 MG tablet etodolac 500 mg tablet  TAKE 1 TABLET BY MOUTH TWICE A DAY WITH FOOD (Patient not taking: Reported on 11/25/2019)   Not Taking at Unknown time  . omeprazole (PRILOSEC OTC) 20 MG tablet Take 1 tablet (20 mg total) by mouth daily. (Patient not taking: Reported on 11/25/2019) 30 tablet 6 Not Taking at Unknown time  . omeprazole (PRILOSEC) 10 MG capsule Take 1 capsule (10 mg total) by mouth daily. 90 capsule 3      No Known Allergies   Past Medical History:  Diagnosis Date  . GERD (gastroesophageal reflux disease)   . Vertigo    at times    Review of systems:  Otherwise negative.    Physical Exam  Gen: Alert, oriented. Appears stated age.  HEENT: [place]/AT. PERRLA. Lungs: No respiratory distress Abd: soft, benign, no masses. Ext: No edema.     Planned procedures: Proceed with colonoscopy. The patient understands the nature of the planned procedure, indications, risks, alternatives and potential complications including but not limited to bleeding, infection, perforation, damage to internal organs and possible oversedation/side effects from anesthesia. The patient  agrees and gives consent to proceed.  Please refer to procedure notes for findings, recommendations and patient disposition/instructions.     Nathan Miyamoto MD, MPH Gastroenterology 11/25/2019  1:17 PM

## 2019-11-25 NOTE — Op Note (Signed)
[facility] Gastroenterology Patient Name: Nathan Fitzpatrick Procedure Date: 11/25/2019 1:24 PM MRN: [id number] Account #: [id number] Date of Birth: [birth year] Admit Type: Outpatient Age: 49 Room: [facility] ENDO ROOM 3 Gender: Male Note Status: Finalized Procedure:             Colonoscopy Indications:           Screening for colorectal malignant neoplasm Providers:             Andrey Farmer MD, MD Referring MD:          No Local Md, MD (Referring MD) Medicines:             Monitored Anesthesia Care Complications:         No immediate complications. Estimated blood loss:                         Minimal. Procedure:             Pre-Anesthesia Assessment:                        - Prior to the procedure, a History and Physical was                         performed, and patient medications and allergies were                         reviewed. The patient is competent. The risks and                         benefits of the procedure and the sedation options and                         risks were discussed with the patient. All questions                         were answered and informed consent was obtained.                         Patient identification and proposed procedure were                         verified by the physician, the nurse, the anesthetist                         and the technician in the endoscopy suite. Mental                         Status Examination: alert and oriented. Airway                         Examination: normal oropharyngeal airway and neck                         mobility. Respiratory Examination: clear to                         auscultation. CV Examination: normal. Prophylactic  Antibiotics: The patient does not require prophylactic                         antibiotics. Prior Anticoagulants: The patient has                         taken no previous anticoagulant or antiplatelet                         agents. ASA Grade  Assessment: I - A normal, healthy                         patient. After reviewing the risks and benefits, the                         patient was deemed in satisfactory condition to                         undergo the procedure. The anesthesia plan was to use                         monitored anesthesia care (MAC). Immediately prior to                         administration of medications, the patient was                         re-assessed for adequacy to receive sedatives. The                         heart rate, respiratory rate, oxygen saturations,                         blood pressure, adequacy of pulmonary ventilation, and                         response to care were monitored throughout the                         procedure. The physical status of the patient was                         re-assessed after the procedure.                        After obtaining informed consent, the colonoscope was                         passed under direct vision. Throughout the procedure,                         the patient's blood pressure, pulse, and oxygen                         saturations were monitored continuously. The                         Colonoscope was introduced through the anus and  advanced to the the terminal ileum. The colonoscopy                         was performed without difficulty. The patient                         tolerated the procedure well. The quality of the bowel                         preparation was excellent. Findings:      The perianal and digital rectal examinations were normal.      The terminal ileum appeared normal.      A few small-mouthed diverticula were found in the sigmoid colon.      A 3 mm polyp was found in the rectum. The polyp was sessile. The polyp       was removed with a cold snare. Resection and retrieval were complete.       Estimated blood loss was minimal.      The exam was otherwise without abnormality on direct and  retroflexion       views. Impression:            - The examined portion of the ileum was normal.                        - Diverticulosis in the sigmoid colon.                        - One 3 mm polyp in the rectum, removed with a cold                         snare. Resected and retrieved.                        - The examination was otherwise normal on direct and                         retroflexion views. Recommendation:        - Discharge patient to home.                        - Resume previous diet.                        - Continue present medications.                        - Await pathology results.                        - Repeat colonoscopy in 7 years for surveillance.                        - Return to referring physician as previously                         scheduled. Procedure Code(s):     --- Professional ---                        [phone], Colonoscopy, flexible; with removal of  tumor(s), polyp(s), or other lesion(s) by snare                         technique Diagnosis Code(s):     --- Professional ---                        Z12.11, Encounter for screening for malignant neoplasm                         of colon                        K62.1, Rectal polyp                        K57.30, Diverticulosis of large intestine without                         perforation or abscess without bleeding CPT copyright 2019 American Medical Association. All rights reserved. The codes documented in this report are preliminary and upon coder review may  be revised to meet current compliance requirements. Andrey Farmer, MD Andrey Farmer MD, MD 11/25/2019 1:50:25 PM Number of Addenda: 0 Note Initiated On: 11/25/2019 1:24 PM Scope Withdrawal Time: 0 hours 12 minutes 18 seconds  Total Procedure Duration: 0 hours 17 minutes 30 seconds  Estimated Blood Loss:  Estimated blood loss: none. Estimated blood loss was                         minimal.      [facility]

## 2019-11-25 NOTE — Anesthesia Preprocedure Evaluation (Signed)
Anesthesia Evaluation  Patient identified by MRN, date of birth, ID band Patient awake    Reviewed: Allergy & Precautions, NPO status , Patient's Chart, lab work & pertinent test results  History of Anesthesia Complications Negative for: history of anesthetic complications  Airway Mallampati: I  TM Distance: >3 FB     Dental  (+) Teeth Intact, Caps, Dental Advidsory Given   Pulmonary neg pulmonary ROS,    Pulmonary exam normal        Cardiovascular negative cardio ROS Normal cardiovascular exam     Neuro/Psych vertigo negative neurological ROS  negative psych ROS   GI/Hepatic Neg liver ROS, GERD (2 yrs ago)  ,  Endo/Other  negative endocrine ROS  Renal/GU negative Renal ROS  negative genitourinary   Musculoskeletal negative musculoskeletal ROS (+)   Abdominal   Peds negative pediatric ROS (+)  Hematology negative hematology ROS (+)   Anesthesia Other Findings Past Medical History: No date: GERD (gastroesophageal reflux disease) No date: Vertigo     Comment:  at times   Reproductive/Obstetrics negative OB ROS                             Anesthesia Physical  Anesthesia Plan  ASA: II  Anesthesia Plan: General   Post-op Pain Management:    Induction: Intravenous  PONV Risk Score and Plan: 2 and Propofol infusion and TIVA  Airway Management Planned: Nasal Cannula and Natural Airway  Additional Equipment:   Intra-op Plan:   Post-operative Plan:   Informed Consent: I have reviewed the patients History and Physical, chart, labs and discussed the procedure including the risks, benefits and alternatives for the proposed anesthesia with the patient or authorized representative who has indicated his/her understanding and acceptance.     Dental advisory given  Plan Discussed with: CRNA and Surgeon  Anesthesia Plan Comments:         Anesthesia Quick Evaluation

## 2019-11-28 ENCOUNTER — Encounter: Payer: Self-pay | Admitting: Gastroenterology

## 2019-11-28 LAB — SURGICAL PATHOLOGY

## 2020-02-18 DIAGNOSIS — N400 Enlarged prostate without lower urinary tract symptoms: Secondary | ICD-10-CM | POA: Insufficient documentation

## 2021-10-07 DIAGNOSIS — E7800 Pure hypercholesterolemia, unspecified: Secondary | ICD-10-CM | POA: Insufficient documentation

## 2021-10-28 ENCOUNTER — Ambulatory Visit
Admission: EM | Admit: 2021-10-28 | Discharge: 2021-10-28 | Disposition: A | Discharge status: Home / Self Care | Payer: 59 | Attending: Physician Assistant | Admitting: Physician Assistant

## 2021-10-28 DIAGNOSIS — B349 Viral infection, unspecified: Secondary | ICD-10-CM | POA: Insufficient documentation

## 2021-10-28 DIAGNOSIS — U071 COVID-19: Secondary | ICD-10-CM | POA: Insufficient documentation

## 2021-10-28 DIAGNOSIS — R051 Acute cough: Secondary | ICD-10-CM | POA: Insufficient documentation

## 2021-10-28 DIAGNOSIS — R0981 Nasal congestion: Secondary | ICD-10-CM | POA: Insufficient documentation

## 2021-10-28 DIAGNOSIS — Z20822 Contact with and (suspected) exposure to covid-19: Secondary | ICD-10-CM | POA: Diagnosis present

## 2021-10-28 LAB — RESP PANEL BY RT-PCR (FLU A&B, COVID) ARPGX2
Influenza A by PCR: NEGATIVE
Influenza B by PCR: NEGATIVE
SARS Coronavirus 2 by RT PCR: POSITIVE — AB

## 2021-10-28 MED ORDER — PROMETHAZINE-DM 6.25-15 MG/5ML PO SYRP: 5.0000 mL | ORAL_SOLUTION | Freq: Four times a day (QID) | ORAL | 0 refills | Status: DC | PRN

## 2021-10-28 NOTE — ED Provider Notes (Signed)
[facility]    CSN: [id number] Arrival date & time: 10/28/21  1623      History   Chief Complaint Chief Complaint  Patient presents with   Cough   Nasal Congestion    HPI Unique Searfoss is a 51 y.o. male presenting for onset of cough, congestion, sore throat, headache, fatigue last night.  Patient reports he has been taking care of his mother and her husband who were recently diagnosed with COVID-19.  He reports concern for COVID-19.  He denies fever, chest pain, shortness of breath, nausea/vomiting or diarrhea.  Patient request to be tested for COVID and flu.  Has not been taking any medicine for symptoms.  No other complaints.  HPI  Past Medical History:  Diagnosis Date   GERD (gastroesophageal reflux disease)    Vertigo    at times    There are no problems to display for this patient.   Past Surgical History:  Procedure Laterality Date   COLONOSCOPY WITH PROPOFOL N/A 11/25/2019   Procedure: COLONOSCOPY WITH PROPOFOL;  Surgeon: Lesly Rubenstein, MD;  Location: [facility] ENDOSCOPY;  Service: Endoscopy;  Laterality: N/A;   ESOPHAGOGASTRODUODENOSCOPY (EGD) WITH PROPOFOL N/A 10/14/2016   Procedure: ESOPHAGOGASTRODUODENOSCOPY (EGD) WITH PROPOFOL;  Surgeon: Lucilla Lame, MD;  Location: [facility] ENDOSCOPY;  Service: Endoscopy;  Laterality: N/A;   WISDOM TOOTH EXTRACTION         Home Medications    Prior to Admission medications   Medication Sig Start Date End Date Taking? Authorizing Provider  promethazine-dextromethorphan (PROMETHAZINE-DM) 6.25-15 MG/5ML syrup Take 5 mLs by mouth 4 (four) times daily as needed. 10/28/21  Yes Danton Clap, PA-C  dexlansoprazole (DEXILANT) 60 MG capsule Take 1 capsule (60 mg total) by mouth daily. Patient not taking: Reported on 11/25/2019 04/09/17   Lucilla Lame, MD  etodolac (LODINE) 500 MG tablet etodolac 500 mg tablet  TAKE 1 TABLET BY MOUTH TWICE A DAY WITH FOOD Patient not taking: No sig reported    [provider]  omeprazole (PRILOSEC OTC) 20 MG tablet Take 1 tablet (20 mg total) by mouth daily. Patient not taking: Reported on 11/25/2019 12/18/17   Lucilla Lame, MD  omeprazole (PRILOSEC) 10 MG capsule Take 1 capsule (10 mg total) by mouth daily. 03/31/18   Lucilla Lame, MD    Family History Family History  Problem Relation Age of Onset   Diverticulitis Mother    Stomach cancer Father    Diverticulitis Maternal Grandmother    Breast cancer Maternal Grandmother     Social History Social History   Tobacco Use   Smoking status: Never   Smokeless tobacco: Never  Vaping Use   Vaping Use: Never used  Substance Use Topics   Alcohol use: No   Drug use: No     Allergies   Patient has no known allergies.   Review of Systems Review of Systems  Constitutional:  Positive for fatigue. Negative for fever.  HENT:  Positive for congestion, rhinorrhea, sinus pressure and sore throat.   Respiratory:  Positive for cough. Negative for shortness of breath.   Gastrointestinal:  Negative for abdominal pain, diarrhea, nausea and vomiting.  Musculoskeletal:  Negative for myalgias.  Neurological:  Positive for headaches. Negative for weakness and light-headedness.  Hematological:  Negative for adenopathy.     Physical Exam Triage Vital Signs ED Triage Vitals  Enc Vitals Group     BP 10/28/21 1706 (!) 131/103     Pulse Rate 10/28/21 1706 (!) 111  Resp --      Temp 10/28/21 1706 98.9 F (37.2 C)     Temp Source 10/28/21 1706 Oral     SpO2 10/28/21 1706 100 %     Weight 10/28/21 1705 205 lb (93 kg)     Height 10/28/21 1705 '5\' 10"'$  (1.778 m)     Head Circumference --      Peak Flow --      Pain Score 10/28/21 1704 6     Pain Loc --      Pain Edu? --      Excl. in [place]? --    No data found.  Updated Vital Signs BP (!) 131/103 (BP Location: Left Arm)   Pulse (!) 111   Temp 98.9 F (37.2 C) (Oral)   Ht '5\' 10"'$  (1.778 m)   Wt 205 lb (93 kg)   SpO2 100%   BMI 29.41 kg/m       Physical Exam Vitals and nursing note reviewed.  Constitutional:      General: He is not in acute distress.    Appearance: Normal appearance. He is well-developed. He is ill-appearing.  HENT:     Head: Normocephalic and atraumatic.     Nose: Congestion present.     Mouth/Throat:     Mouth: Mucous membranes are moist.     Pharynx: Oropharynx is clear. Posterior oropharyngeal erythema present.  Eyes:     General: No scleral icterus.    Conjunctiva/sclera: Conjunctivae normal.  Cardiovascular:     Rate and Rhythm: Regular rhythm. Tachycardia present.  Pulmonary:     Effort: Pulmonary effort is normal. No respiratory distress.     Breath sounds: Normal breath sounds.  Musculoskeletal:     Cervical back: Neck supple.  Skin:    General: Skin is warm and dry.     Capillary Refill: Capillary refill takes less than 2 seconds.  Neurological:     General: No focal deficit present.     Mental Status: He is alert. Mental status is at baseline.     Motor: No weakness.     Gait: Gait normal.  Psychiatric:        Mood and Affect: Mood normal.        Behavior: Behavior normal.        Thought Content: Thought content normal.      UC Treatments / Results  Labs (all labs ordered are listed, but only abnormal results are displayed) Labs Reviewed  RESP PANEL BY RT-PCR (FLU A&B, COVID) ARPGX2 - Abnormal; Notable for the following components:      Result Value   SARS Coronavirus 2 by RT PCR POSITIVE (*)    All other components within normal limits    EKG   Radiology No results found.  Procedures Procedures (including critical care time)  Medications Ordered in UC Medications - No data to display  Initial Impression / Assessment and Plan / UC Course  I have reviewed the triage vital signs and the nursing notes.  Pertinent labs & imaging results that were available during my care of the patient were reviewed by me and considered in my medical decision making (see chart for  details).   51 year old male presents for fatigue, headache, sinus pressure, nasal congestion, sore throat and cough since last night.  Has been exposed to COVID-19.  Patient's BP is elevated at 131/103 and pulse elevated at 111 bpm.  He is ill-appearing but nontoxic.  On exam he has nasal congestion and erythema posterior  pharynx.  Chest clear to auscultation.  Respiratory panel obtained.  Patient would like to be called with the results.  Reviewed current CDC guidelines, isolation protocol and ED precautions with patient if he test positive.  Sent Promethazine DM to pharmacy and encouraged him to increase rest and fluids.  Reviewed return precautions.  Work note provided.  COVID-positive.  Call discussed results with patient.  Patient does not meet criteria for antiviral medication.  Final Clinical Impressions(s) / UC Diagnoses   Final diagnoses:  Viral illness  Acute cough  Nasal congestion  Exposure to COVID-19 virus  COVID-19     Discharge Instructions      -We will call if the COVID test is positive.  If you do not hear from you the COVID test is negative but I would suggest taking an at home test or another test in the next 2 days if you are still having symptoms given your close exposure to COVID. - Could be another virus if the COVID/flu test is negative. - No need to isolate 5 days and wear mask x5 days if COVID is positive.  Day 1 is today. - I have sent cough medicine to pharmacy.  Treatment is supportive with over-the-counter decongestants, throat lozenges, plenty rest and fluids. - Return or go to ER if you have any acute worsening of your symptoms.     ED Prescriptions     Medication Sig Dispense Auth. Provider   promethazine-dextromethorphan (PROMETHAZINE-DM) 6.25-15 MG/5ML syrup Take 5 mLs by mouth 4 (four) times daily as needed. 118 mL Danton Clap, PA-C      PDMP not reviewed this encounter.   Danton Clap, PA-C 10/28/21 1813

## 2021-10-28 NOTE — Discharge Instructions (Addendum)
-  We will call if the COVID test is positive.  If you do not hear from you the COVID test is negative but I would suggest taking an at home test or another test in the next 2 days if you are still having symptoms given your close exposure to COVID. - Could be another virus if the COVID/flu test is negative. - No need to isolate 5 days and wear mask x5 days if COVID is positive.  Day 1 is today. - I have sent cough medicine to pharmacy.  Treatment is supportive with over-the-counter decongestants, throat lozenges, plenty rest and fluids. - Return or go to ER if you have any acute worsening of your symptoms.

## 2021-10-28 NOTE — ED Triage Notes (Signed)
Pt c/o cough, sore throat, congestion, headache, sinus pressure, fatigue x2day  Pt would like a covid and flu test done.

## 2022-03-07 DIAGNOSIS — G5630 Lesion of radial nerve, unspecified upper limb: Secondary | ICD-10-CM | POA: Insufficient documentation

## 2022-03-07 DIAGNOSIS — M1812 Unilateral primary osteoarthritis of first carpometacarpal joint, left hand: Secondary | ICD-10-CM | POA: Insufficient documentation

## 2022-08-15 ENCOUNTER — Ambulatory Visit: Payer: Self-pay | Admitting: Urology

## 2022-08-26 DIAGNOSIS — M1A0790 Idiopathic chronic gout, unspecified ankle and foot, without tophus (tophi): Secondary | ICD-10-CM | POA: Insufficient documentation

## 2022-09-03 ENCOUNTER — Ambulatory Visit: Payer: 59 | Admitting: Urology

## 2022-09-03 VITALS — BP 130/88 | HR 80 | Ht 70.0 in | Wt 196.1 lb

## 2022-09-03 DIAGNOSIS — N401 Enlarged prostate with lower urinary tract symptoms: Secondary | ICD-10-CM | POA: Diagnosis not present

## 2022-09-03 DIAGNOSIS — R339 Retention of urine, unspecified: Secondary | ICD-10-CM

## 2022-09-03 DIAGNOSIS — N400 Enlarged prostate without lower urinary tract symptoms: Secondary | ICD-10-CM

## 2022-09-03 DIAGNOSIS — R300 Dysuria: Secondary | ICD-10-CM

## 2022-09-03 LAB — URINALYSIS, COMPLETE
Bilirubin, UA: NEGATIVE
Glucose, UA: NEGATIVE
Ketones, UA: NEGATIVE
Leukocytes,UA: NEGATIVE
Nitrite, UA: NEGATIVE
Protein,UA: NEGATIVE
Specific Gravity, UA: 1.01 (ref 1.005–1.030)
Urobilinogen, Ur: 0.2 mg/dL (ref 0.2–1.0)
pH, UA: 7 (ref 5.0–7.5)

## 2022-09-03 LAB — MICROSCOPIC EXAMINATION
Bacteria, UA: NONE SEEN
Epithelial Cells (non renal): NONE SEEN /hpf (ref 0–10)

## 2022-09-03 LAB — BLADDER SCAN AMB NON-IMAGING: Scan Result: 90

## 2022-09-03 MED ORDER — GEMTESA 75 MG PO TABS: 75.0000 mg | ORAL_TABLET | Freq: Every day | ORAL | Status: DC

## 2022-09-03 NOTE — Progress Notes (Addendum)
I, Duke Salvia, acting as a scribe for Riki Altes, MD., have documented all relevant documentation on the behalf of Riki Altes, MD, as directed by  Riki Altes, MD while in the presence of Riki Altes, MD.   09/03/2022 2:39 PM   Niel Hummer 1970/02/23 [id number]  Referring provider: Marisue Ivan, MD [phone] [facility] [address] [facility] [place],  [place] [address]  Chief Complaint  Patient presents with   Establish Care   Urinary Frequency    HPI: Nathan Fitzpatrick is a 52 y.o. male referred for evaluation of lower urinary tract symptoms.  Saw Dr. Burnadette Pop 07/11/22 with complaints of urinary frequency and urgency. Symptoms started around 2021 with complaints of urinary frequency, decreased stream, sensation of incomplete emptying. IPSS at that visit was 16/35. Seen [facility] 03/2021 and was started on Tamsulosin without improvement in his symptoms. Solifenacin subsequently added. He had no significant improvement on the Solifenacin. He also tried Tadalafil. He stated that at his follow up visit with Dr. Burnadette Pop it was recommended he increase his Tamsulosin to 0.8 mg and Finasteride was added. However, he was not taking any medications at the time of that visit and did not start the Tamsulosin or Finasteride. IPSS today 17/35 with most bothersome symptoms being urinary frequency and urgency.   PMH: Past Medical History:  Diagnosis Date   GERD (gastroesophageal reflux disease)    Vertigo    at times    Surgical History: Past Surgical History:  Procedure Laterality Date   COLONOSCOPY WITH PROPOFOL N/A 11/25/2019   Procedure: COLONOSCOPY WITH PROPOFOL;  Surgeon: Regis Bill, MD;  Location: [facility] ENDOSCOPY;  Service: Endoscopy;  Laterality: N/A;   ESOPHAGOGASTRODUODENOSCOPY (EGD) WITH PROPOFOL N/A 10/14/2016   Procedure: ESOPHAGOGASTRODUODENOSCOPY (EGD) WITH PROPOFOL;  Surgeon: Midge Minium, MD;  Location:  [facility] ENDOSCOPY;  Service: Endoscopy;  Laterality: N/A;   WISDOM TOOTH EXTRACTION      Home Medications:  Allergies as of 09/03/2022   No Known Allergies      Medication List        Accurate as of September 03, 2022  2:39 PM. If you have any questions, ask your nurse or doctor.          STOP taking these medications    dexlansoprazole 60 MG capsule Commonly known as: Dexilant Stopped by: Riki Altes   etodolac 500 MG tablet Commonly known as: LODINE Stopped by: Riki Altes   omeprazole 10 MG capsule Commonly known as: PriLOSEC Stopped by: Riki Altes   omeprazole 20 MG tablet Commonly known as: PRILOSEC OTC Stopped by: Riki Altes   promethazine-dextromethorphan 6.25-15 MG/5ML syrup Commonly known as: PROMETHAZINE-DM Stopped by: Riki Altes       TAKE these medications    Gemtesa 75 MG Tabs Generic drug: Vibegron Take 1 tablet (75 mg total) by mouth daily. Started by: Riki Altes        Family History: Family History  Problem Relation Age of Onset   Diverticulitis Mother    Stomach cancer Father    Diverticulitis Maternal Grandmother    Breast cancer Maternal Grandmother     Social History:  reports that he has never smoked. He has never used smokeless tobacco. He reports that he does not drink alcohol and does not use drugs.   Physical Exam: BP 130/88   Pulse 80   Ht 5\' 10"  (1.778 m)   Wt 196 lb 2 oz (89 kg)  BMI 28.14 kg/m   Constitutional:  Alert and oriented, No acute distress. HEENT: [place] AT Respiratory: Normal respiratory effort, no increased work of breathing. GU: Prostate 30 g, smooth without nodules. Psychiatric: Normal mood and affect.   Assessment & Plan:    1. BPH with moderate LUTS No significant improvement on Tamsulosin, Tadalafil, and Solifenacin. PVR today 90 mL. Would not recommend starting Finasteride. Trial of Gemtesa 75 mg daily-samples given. Follow up in one month for symptom recheck and if  no improvement will perform cystoscopy at that visit.  I have reviewed the above documentation for accuracy and completeness, and I agree with the above.   Riki Altes, MD  [facility] [address] [place], [place] [address] [phone]

## 2022-09-04 ENCOUNTER — Encounter: Payer: Self-pay | Admitting: Urology

## 2022-10-01 ENCOUNTER — Other Ambulatory Visit: Payer: 59 | Admitting: Urology

## 2022-10-17 ENCOUNTER — Encounter: Payer: Self-pay | Admitting: Urology

## 2022-10-17 ENCOUNTER — Ambulatory Visit: Payer: 59 | Admitting: Urology

## 2022-10-17 VITALS — BP 122/88 | HR 66 | Ht 69.0 in | Wt 195.0 lb

## 2022-10-17 DIAGNOSIS — R350 Frequency of micturition: Secondary | ICD-10-CM | POA: Diagnosis not present

## 2022-10-17 DIAGNOSIS — R399 Unspecified symptoms and signs involving the genitourinary system: Secondary | ICD-10-CM

## 2022-10-17 DIAGNOSIS — R351 Nocturia: Secondary | ICD-10-CM

## 2022-10-17 DIAGNOSIS — N400 Enlarged prostate without lower urinary tract symptoms: Secondary | ICD-10-CM

## 2022-10-17 LAB — URINALYSIS, COMPLETE
Bilirubin, UA: NEGATIVE
Glucose, UA: NEGATIVE
Ketones, UA: NEGATIVE
Leukocytes,UA: NEGATIVE
Nitrite, UA: NEGATIVE
Protein,UA: NEGATIVE
Specific Gravity, UA: 1.015 (ref 1.005–1.030)
Urobilinogen, Ur: 0.2 mg/dL (ref 0.2–1.0)
pH, UA: 7 (ref 5.0–7.5)

## 2022-10-17 LAB — MICROSCOPIC EXAMINATION: Bacteria, UA: NONE SEEN

## 2022-10-17 MED ORDER — MIRABEGRON ER 50 MG PO TB24: 50.0000 mg | ORAL_TABLET | Freq: Every day | ORAL | 11 refills | Status: AC

## 2022-10-17 NOTE — Progress Notes (Signed)
I, Maysun Anabel Bene, acting as a scribe for Riki Altes, MD., have documented all relevant documentation on the behalf of Riki Altes, MD, as directed by Riki Altes, MD while in the presence of Riki Altes, MD.  10/17/2022 2:15 PM   Niel Hummer November 06, 1970 [id number]  Referring provider: Manfred Arch, MD No address on file  Chief Complaint  Patient presents with   Follow-up   Urologic history: 1. BPH with moderate LUTS On Gemtesa   HPI: Nathan Fitzpatrick is a 52 y.o. male who presents for follow-up visit.   Initially seen 09/03/22 for lower urinary tract symptoms.  He was given a trial of Gemtesa and did note significant improvement in his nocturia and less urinary frequency.  Was scheduled for cystoscopy today if having persistent symptoms and he elected not to pursue due to his improvement.  He had initially been referred to [facility] and had an appointment that he decided to keep and other options were discussed including pelvic floor physical therapy and urodynamic study.   PMH: Past Medical History:  Diagnosis Date   GERD (gastroesophageal reflux disease)    Vertigo    at times    Surgical History: Past Surgical History:  Procedure Laterality Date   COLONOSCOPY WITH PROPOFOL N/A 11/25/2019   Procedure: COLONOSCOPY WITH PROPOFOL;  Surgeon: Regis Bill, MD;  Location: [facility] ENDOSCOPY;  Service: Endoscopy;  Laterality: N/A;   ESOPHAGOGASTRODUODENOSCOPY (EGD) WITH PROPOFOL N/A 10/14/2016   Procedure: ESOPHAGOGASTRODUODENOSCOPY (EGD) WITH PROPOFOL;  Surgeon: Midge Minium, MD;  Location: [facility] ENDOSCOPY;  Service: Endoscopy;  Laterality: N/A;   WISDOM TOOTH EXTRACTION      Home Medications:  Allergies as of 10/17/2022   No Known Allergies      Medication List        Accurate as of October 17, 2022  2:15 PM. If you have any questions, ask your nurse or doctor.          STOP taking these  medications    Gemtesa 75 MG Tabs Generic drug: Vibegron Stopped by: Riki Altes       TAKE these medications    mirabegron ER 50 MG Tb24 tablet Commonly known as: Myrbetriq Take 1 tablet (50 mg total) by mouth daily.        Allergies: No Known Allergies  Family History: Family History  Problem Relation Age of Onset   Diverticulitis Mother    Stomach cancer Father    Diverticulitis Maternal Grandmother    Breast cancer Maternal Grandmother     Social History:  reports that he has never smoked. He has never been exposed to tobacco smoke. He has never used smokeless tobacco. He reports that he does not drink alcohol and does not use drugs.   Physical Exam: BP 122/88   Pulse 66   Ht 5\' 9"  (1.753 m)   Wt 195 lb (88.5 kg)   BMI 28.80 kg/m   Constitutional:  Alert and oriented, No acute distress. HEENT: [place] AT, moist mucus membranes.  Trachea midline, no masses. Cardiovascular: No clubbing, cyanosis, or edema. Respiratory: Normal respiratory effort, no increased work of breathing. GI: Abdomen is soft, nontender, nondistended, no abdominal masses Skin: No rashes, bruises or suspicious lesions. Neurologic: Grossly intact, no focal deficits, moving all 4 extremities. Psychiatric: Normal mood and affect.   Urinalysis Microscopy negative  Assessment & Plan:    1. Lower urinary tract symptoms We discussed potential etiologies again including BPH, chronic  prostatitis, and chronic pelvic pain syndrome He has noted improvement on Gemtesa and is currently satisfied with his voiding pattern. A prescription has not been sent in but he is concerned about the cost of the medication.  He was interested in a trial of Myrbetriq which would be less expensive- Rx 50 mg dose sent to pharmacy He will call back regarding efficacy of Myrbetriq and whether he wants to continue Myrbetriq or resume Gemtesa.  6 month follow-up and instructed to call earlier for any worsening  symptoms.  [facility] [address], [address] [place], [place] [address] [phone]

## 2022-10-18 ENCOUNTER — Encounter: Payer: Self-pay | Admitting: Urology

## 2023-04-15 ENCOUNTER — Ambulatory Visit: Payer: Self-pay | Admitting: Urology

## 2023-04-17 ENCOUNTER — Ambulatory Visit: Payer: 59 | Admitting: Urology
